# Patient Record
Sex: Male | Born: 2000 | Race: White | Hispanic: No | Marital: Single | Smoking: Never smoker
Health system: Southern US, Community
[De-identification: ages and names within clinical notes are randomized; demographics above are authoritative.]

## PROBLEM LIST (undated history)

## (undated) DIAGNOSIS — J45909 Unspecified asthma, uncomplicated: Secondary | ICD-10-CM

---
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
## 2022-01-20 NOTE — Progress Notes (Signed)
? ?01/21/22 ?12:05 PM  ? ?[name] ?[birth year] ?[id number] ? ?Referring provider:  ?Marisue Ivan, MD ?[phone] ?[facility] ?[place],  [place] [address] ?Chief Complaint  ?Patient presents with  ? varicocele  ? ? ? ?HPI: ?Christian Ryan is a 21 y.o.male who presents today for further evaluation of left variocele.  ? ?He was seen at urgent care on 01/14/2022 for intermittent pain in his testicles and swelling in left testicle that had been ongoing for several months. He was further referred to urology.  ? ?He reports that he has been having swelling an pain in his testicles that has been ongoing. It is a dull pain that does not move, he denies any injuries. He denies any urinary issues.  No trauma.   ? ? ?PMH: ?Past Medical History:  ?Diagnosis Date  ? Asthma   ? ? ?Surgical History: ?History reviewed. No pertinent surgical history. ? ?Home Medications:  ?Allergies as of 01/21/2022   ? ?   Reactions  ? Zithromax [azithromycin] Hives  ? ?  ? ?  ?Medication List  ?  ? ?  ? Accurate as of January 21, 2022 12:05 PM. If you have any questions, ask your nurse or doctor.  ?  ?  ? ?  ? ?STOP taking these medications   ? ?HYDROcodone-acetaminophen 5-325 MG tablet ?Commonly known as: NORCO/VICODIN ?Stopped by: Vanna Scotland, MD ?  ? ?  ? ?TAKE these medications   ? ?ALBUTEROL IN ?Inhale into the lungs. ?  ?multivitamin tablet ?Take 1 tablet by mouth daily. ?  ? ?  ? ? ?Allergies:  ?Allergies  ?Allergen Reactions  ? Zithromax [Azithromycin] Hives  ? ? ?Family History: ?No family history on file. ? ?Social History:  reports that he has never smoked. He does not have any smokeless tobacco history on file. No history on file for alcohol use and drug use. ? ? ?Physical Exam: ?BP (!) 161/85   Pulse 84   Ht 6\' 2"  (1.88 m)   Wt 178 lb (80.7 kg)   BMI 22.85 kg/m?   ?Constitutional:  Alert and oriented, No acute distress. ?HEENT: [place] AT, moist mucus membranes.  Trachea midline, no masses. ?Cardiovascular: No  clubbing, cyanosis, or edema. ?Respiratory: Normal respiratory effort, no increased work of breathing. ?GI: no evidence of inguinal hernia  ?GU: Grade 3 large left sided varicocele, no evidence of testicular atrophy or masses  ?Skin: No rashes, bruises or suspicious lesions. ?Neurologic: Grossly intact, no focal deficits, moving all 4 extremities. ?Psychiatric: Normal mood and affect. ? ? ?Urinalysis ?Negative  ? ?Assessment & Plan:   ? ?Left sided Varicocele  ?- Symptomatic;  on exam it is a clinically significant varicocele  ?-We discussed how overtime varicoceles may affect male fertility and can cause atrophy.  ?- We discussed surgical treatment options if he is concerned about his fertility such as laparoscopic varicocelectomy versus microscopic subinguinal variococelectomy.  He would be a good candidate for surgery. Will refer him to urologist that specializes in varicocelectomy  ?- Will defer scrotal ultrasound decision to urologist  that specializes in varicocelectomy  ?- Discussed addition of compressive care to his regimen such as compressive underwear and or jock strap, as well as addition of NSAIDs.  ? ? ? ?I,Kailey Littlejohn,acting as a scribe for , MD.,have documented all relevant documentation on the behalf of Vanna Scotland, MD,as directed by  Vanna Scotland, MD while in the presence of Vanna Scotland, MD. ? ?I have reviewed  the above documentation for accuracy and completeness, and I agree with the above.  ? ?Vanna Scotland, MD ? ? ?[facility] ?[address] ?[place], [place] [address] ?[phone] ? ?

## 2022-01-21 ENCOUNTER — Encounter: Payer: Self-pay | Admitting: Urology

## 2022-01-21 ENCOUNTER — Other Ambulatory Visit: Payer: Self-pay

## 2022-01-21 ENCOUNTER — Ambulatory Visit (INDEPENDENT_AMBULATORY_CARE_PROVIDER_SITE_OTHER): Payer: 59 | Admitting: Urology

## 2022-01-21 VITALS — BP 161/85 | HR 84 | Ht 74.0 in | Wt 178.0 lb

## 2022-01-21 DIAGNOSIS — I861 Scrotal varices: Secondary | ICD-10-CM

## 2022-01-21 NOTE — Patient Instructions (Signed)
Varicocele  A varicocele is a swelling of veins in the scrotum. The scrotum is the sac that contains the testicles. Varicoceles can occur on either side of the scrotum, but they are more common on the left side. They occur most often in teenageboys and young men. In most cases, varicoceles are not a serious problem. They are usually small and painless and do not require treatment. Tests may be done to confirm the diagnosis. Treatment may be needed if: A varicocele is large, causes a lot of pain, or causes pain when exercising. Varicoceles are found on both sides of the scrotum. A varicocele causes a decrease in the size of the testicle in a growing adolescent. The person has fertility problems. What are the causes? This condition is the result of valves in the veins not working properly. Valves in the veins help to return blood from the scrotum and testicles to the heart. If these valves do not work well, blood flows backward and backs up into the veins, which causes the veins to swell. This is similar to what happenswhen varicose veins form in the leg. What are the signs or symptoms? Most varicoceles do not cause any symptoms. If symptoms do occur, they may include: Swelling on one side of the scrotum. The swelling may be more obvious when you are standing up. A lumpy feeling in the scrotum. A heavy feeling on one side of the scrotum. A dull ache in the scrotum, especially after exercise or prolonged standing or sitting. Slower growth or reduced size of the testicle on the side of the varicocele (in young males). Problems with fathering a child (fertility). This can occur if the testicle does not grow normally or if the condition causes problems with the sperm, such as a low sperm count or sperm that are not able to reach the egg (poor motility). How is this diagnosed? This condition is diagnosed based on: Your medical history. A physical exam. Your health care provider may inspect and feel  (palpate) the scrotal area to check for swollen or enlarged veins. An ultrasound. This may be done to confirm the diagnosis and to help rule out other causes of the swelling. How is this treated? Treatment is usually not needed for this condition. If you have any pain, your health care provider may prescribe or recommend medicine to help relieve it. You may need regular exams so your health care provider can monitor thevaricocele to ensure that it does not cause problems. When further treatment is needed, it may involve one of these options: Varicocelectomy. This is a surgery in which the swollen veins are tied off so that the flow of blood goes to other veins instead. Embolization. In this procedure, a small, thin tube (catheter) is used to place metal coils or other blocking items in the veins. This cuts off the blood flow to the swollen veins. Follow these instructions at home: Take over-the-counter and prescription medicines only as told by your health care provider. Wear supportive underwear. Use an athletic supporter when participating in sports activities. Keep all follow-up visits as told by your health care provider. This is important. Contact a health care provider if: Your pain is increasing. You have redness in the affected area. Your testicle becomes enlarged, swollen, or painful. You have swelling that does not decrease when you are lying down. One of your testicles is smaller than the other. Get help right away if: You develop swelling in your legs. You have difficulty breathing. Summary Varicocele is   a condition in which the veins in the scrotum are swollen or enlarged. In most cases, varicoceles do not require treatment. Treatment may be needed if you have pain, have problems with infertility, or have a smaller testicle associated with the varicocele. In some cases, the condition may be treated with a procedure to cut off the flow of blood to the swollen veins. This  information is not intended to replace advice given to you by your health care provider. Make sure you discuss any questions you have with your healthcare provider. Document Revised: 01/07/2019 Document Reviewed: 01/07/2019 Elsevier Patient Education  2022 Elsevier Inc.  

## 2022-01-22 LAB — URINALYSIS, COMPLETE
Bilirubin, UA: NEGATIVE
Glucose, UA: NEGATIVE
Ketones, UA: NEGATIVE
Leukocytes,UA: NEGATIVE
Nitrite, UA: NEGATIVE
Protein,UA: NEGATIVE
RBC, UA: NEGATIVE
Specific Gravity, UA: 1.02 (ref 1.005–1.030)
Urobilinogen, Ur: 0.2 mg/dL (ref 0.2–1.0)
pH, UA: 7.5 (ref 5.0–7.5)

## 2022-01-22 LAB — MICROSCOPIC EXAMINATION
Bacteria, UA: NONE SEEN
Epithelial Cells (non renal): NONE SEEN /hpf (ref 0–10)

## 2022-07-04 DIAGNOSIS — J029 Acute pharyngitis, unspecified: Secondary | ICD-10-CM | POA: Diagnosis not present

## 2023-02-24 DIAGNOSIS — L2089 Other atopic dermatitis: Secondary | ICD-10-CM | POA: Diagnosis not present

## 2023-03-04 DIAGNOSIS — K120 Recurrent oral aphthae: Secondary | ICD-10-CM | POA: Diagnosis not present

## 2023-03-04 DIAGNOSIS — L0100 Impetigo, unspecified: Secondary | ICD-10-CM | POA: Diagnosis not present

## 2024-06-16 ENCOUNTER — Telehealth: Payer: Self-pay

## 2024-06-16 ENCOUNTER — Other Ambulatory Visit (HOSPITAL_COMMUNITY): Payer: Self-pay

## 2024-06-16 ENCOUNTER — Ambulatory Visit: Payer: Self-pay

## 2024-06-16 ENCOUNTER — Ambulatory Visit (INDEPENDENT_AMBULATORY_CARE_PROVIDER_SITE_OTHER): Admitting: Pharmacist

## 2024-06-16 ENCOUNTER — Other Ambulatory Visit: Payer: Self-pay

## 2024-06-16 ENCOUNTER — Other Ambulatory Visit (HOSPITAL_COMMUNITY)
Admission: RE | Admit: 2024-06-16 | Discharge: 2024-06-16 | Disposition: A | Discharge status: Home / Self Care | Source: Ambulatory Visit | Attending: Infectious Disease | Admitting: Infectious Disease

## 2024-06-16 DIAGNOSIS — Z113 Encounter for screening for infections with a predominantly sexual mode of transmission: Secondary | ICD-10-CM

## 2024-06-16 DIAGNOSIS — Z206 Contact with and (suspected) exposure to human immunodeficiency virus [HIV]: Secondary | ICD-10-CM

## 2024-06-16 MED ORDER — BICTEGRAVIR-EMTRICITAB-TENOFOV 50-200-25 MG PO TABS: 1.0000 | ORAL_TABLET | Freq: Every day | ORAL | Status: AC

## 2024-06-16 NOTE — Telephone Encounter (Signed)
 Pharmacy Patient Advocate Encounter  Insurance verification completed.   The patient is insured through [employer] claim for [employer]. Currently a quantity of 30 is a 30 day supply and the co-pay is $4078.00 .   This test claim was processed through [facility]- copay amounts may vary at other pharmacies due to pharmacy/plan contracts, or as the patient moves through the different stages of their insurance plan.

## 2024-06-16 NOTE — Progress Notes (Signed)
 NEW REFERRAL TO [facility]  Medication Samples have been provided to the patient.  Drug name: Biktarvy        Strength: 50/200/25 mg       Qty: 28 tablets (4 bottles) LOT: [id number]   Exp.Date: 7/27  Samples requested by Alan Geralds, PharmD.  Dosing instructions: Take one tablet by mouth once daily  The patient has been instructed regarding the correct time, dose, and frequency of taking this medication, including desired effects and most common side effects.   Alan Geralds, PharmD, CPP, BCIDP, AAHIVP Clinical Pharmacist Practitioner Infectious Diseases Clinical Pharmacist [facility]

## 2024-06-16 NOTE — Progress Notes (Signed)
   HPI: Christian Ryan is a 23 y.o. male who presents to the [facility] pharmacy clinic to discuss and initiate PEP.  Insured   [x]    Uninsured  []    There are no active problems to display for this patient.   Patient's Medications  New Prescriptions   BICTEGRAVIR-EMTRICITABINE-TENOFOVIR AF (BIKTARVY) 50-200-25 MG TABS TABLET    Take 1 tablet by mouth daily for 28 days.  Previous Medications   ALBUTEROL IN    Inhale into the lungs.   MULTIPLE VITAMIN (MULTIVITAMIN) TABLET    Take 1 tablet by mouth daily.  Modified Medications   No medications on file  Discontinued Medications   No medications on file        No data to display          Labs:  SCr: No results found for: CREATININE HIV No results found for: HIV Hepatitis B No results found for: HEPBSAB, HEPBSAG, HEPBCAB Hepatitis C No results found for: HEPCAB, HCVRNAPCRQN Hepatitis A No results found for: HAV RPR and STI No results found for: LABRPR, RPRTITER      No data to display          Assessment: Murray is here for PEP. His potential exposure was yesterday (06/16/23). He reports the partner did not disclose their status and they did not use a condom. We discussed the ongoing research study and PrEP as an prevention option moving forward; he would like to think about it and discuss further at his next appointment.  We discussed that he is eligible for the HPV vaccine which he deferred today. Will check an HIV antibody, BMP to assess baseline kidney function, HepA antibody, HepB antibody and antigen, and HepC antibody today. Agrees to STI testing. We supplied 28 days of samples of Biktarvy for him to start today. Will follow up in one month.   Plan: -HIV antibody -HepA antibody -HepB antibody and antigen -HepC antibody -STI testing -BMP -Start Biktarvy -Follow up 07/19/24  Elma Fail, PharmD PGY1 Clinical Pharmacist [facility]  06/16/2024 2:56 PM

## 2024-06-17 LAB — BASIC METABOLIC PANEL WITH GFR
BUN: 13 mg/dL (ref 7–25)
CO2: 26 mmol/L (ref 20–32)
Calcium: 9.9 mg/dL (ref 8.6–10.3)
Chloride: 104 mmol/L (ref 98–110)
Creat: 0.62 mg/dL (ref 0.60–1.24)
Glucose, Bld: 90 mg/dL (ref 65–99)
Potassium: 3.6 mmol/L (ref 3.5–5.3)
Sodium: 141 mmol/L (ref 135–146)
eGFR: 139 mL/min/1.73m2 (ref >=60)

## 2024-06-17 LAB — CYTOLOGY, (ORAL, ANAL, URETHRAL) ANCILLARY ONLY
Chlamydia: NEGATIVE
Chlamydia: NEGATIVE
Comment: NEGATIVE
Comment: NEGATIVE
Comment: NORMAL
Comment: NORMAL
Neisseria Gonorrhea: NEGATIVE
Neisseria Gonorrhea: NEGATIVE

## 2024-06-17 LAB — HEPATITIS A ANTIBODY, TOTAL: Hepatitis A AB,Total: REACTIVE — AB

## 2024-06-17 LAB — HIV ANTIBODY (ROUTINE TESTING W REFLEX): HIV 1&2 Ab, 4th Generation: NONREACTIVE

## 2024-06-17 LAB — HEPATITIS B CORE ANTIBODY, TOTAL: Hep B Core Total Ab: NONREACTIVE

## 2024-06-17 LAB — HEPATITIS C ANTIBODY: Hepatitis C Ab: NONREACTIVE

## 2024-06-17 LAB — RPR: RPR Ser Ql: NONREACTIVE

## 2024-06-17 LAB — HEPATITIS B SURFACE ANTIBODY,QUALITATIVE: Hep B S Ab: NONREACTIVE

## 2024-06-17 LAB — HEPATITIS B SURFACE ANTIGEN: Hepatitis B Surface Ag: NONREACTIVE

## 2024-07-19 ENCOUNTER — Ambulatory Visit: Admitting: Pharmacist
